# Patient Record
Sex: Female | Born: 1990 | Race: White | Hispanic: No | Marital: Single | Smoking: Current every day smoker
Health system: Southern US, Community
[De-identification: ages and names within clinical notes are randomized; demographics above are authoritative.]

## PROBLEM LIST (undated history)

## (undated) DIAGNOSIS — E079 Disorder of thyroid, unspecified: Secondary | ICD-10-CM

## (undated) HISTORY — PX: BUNIONECTOMY: SHX129

---

## 2017-03-12 ENCOUNTER — Encounter (HOSPITAL_COMMUNITY): Payer: Self-pay | Admitting: Emergency Medicine

## 2017-03-12 ENCOUNTER — Emergency Department (HOSPITAL_COMMUNITY)
Admission: EM | Admit: 2017-03-12 | Discharge: 2017-03-12 | Disposition: A | Discharge status: Home / Self Care | Payer: Medicaid Other | Attending: Emergency Medicine | Admitting: Emergency Medicine

## 2017-03-12 ENCOUNTER — Emergency Department (HOSPITAL_COMMUNITY): Payer: Medicaid Other

## 2017-03-12 DIAGNOSIS — O2341 Unspecified infection of urinary tract in pregnancy, first trimester: Secondary | ICD-10-CM | POA: Insufficient documentation

## 2017-03-12 DIAGNOSIS — Z3A13 13 weeks gestation of pregnancy: Secondary | ICD-10-CM | POA: Diagnosis not present

## 2017-03-12 DIAGNOSIS — O9989 Other specified diseases and conditions complicating pregnancy, childbirth and the puerperium: Secondary | ICD-10-CM | POA: Insufficient documentation

## 2017-03-12 DIAGNOSIS — O26899 Other specified pregnancy related conditions, unspecified trimester: Secondary | ICD-10-CM

## 2017-03-12 DIAGNOSIS — M545 Low back pain, unspecified: Secondary | ICD-10-CM

## 2017-03-12 DIAGNOSIS — N390 Urinary tract infection, site not specified: Secondary | ICD-10-CM

## 2017-03-12 DIAGNOSIS — R102 Pelvic and perineal pain: Secondary | ICD-10-CM

## 2017-03-12 HISTORY — DX: Disorder of thyroid, unspecified: E07.9

## 2017-03-12 LAB — URINALYSIS, ROUTINE W REFLEX MICROSCOPIC
Bilirubin Urine: NEGATIVE
Glucose, UA: NEGATIVE mg/dL
Hgb urine dipstick: NEGATIVE
Ketones, ur: NEGATIVE mg/dL
Nitrite: NEGATIVE
Protein, ur: NEGATIVE mg/dL
Specific Gravity, Urine: 1.016 (ref 1.005–1.030)
pH: 6 (ref 5.0–8.0)

## 2017-03-12 LAB — I-STAT BETA HCG BLOOD, ED (MC, WL, AP ONLY): I-stat hCG, quantitative: >2000 m[IU]/mL — ABNORMAL HIGH (ref <5)

## 2017-03-12 MED ORDER — CEPHALEXIN 500 MG PO CAPS: 500.0000 mg | ORAL_CAPSULE | Freq: Two times a day (BID) | ORAL | 0 refills | Status: DC

## 2017-03-12 NOTE — Discharge Instructions (Addendum)
Please read attached information. If you experience any new or worsening signs or symptoms please return to the emergency room for evaluation. Please follow-up with your primary care provider or specialist as discussed.  °

## 2017-03-12 NOTE — ED Notes (Signed)
ED Provider at bedside. 

## 2017-03-12 NOTE — ED Notes (Addendum)
Pt reports she has had back pain and abdominal pain. She also reports having been pregnant 8 times with one living child who is 7 months. Pt states she is somewhere less than 20 weeks but has not been to an OB.

## 2017-03-12 NOTE — ED Triage Notes (Signed)
Pt reports [redacted] wks pregnant, hip, lower back and lower abd pain x2 weeks, denies n/v/d or urinary symptoms or bleeding, had another pregnancy 7 months ago.

## 2017-03-12 NOTE — ED Notes (Signed)
Pt transported to [facility]

## 2017-03-12 NOTE — ED Provider Notes (Signed)
[facility]-EMERGENCY DEPT Provider Note   CSN: [id number] Arrival date & time: 03/12/17  1455     History   Chief Complaint Chief Complaint  Patient presents with  . Back Pain  . Abdominal Pain    HPI Lori Woods is a 26 y.o. female.  HPI    [age] YOF Lori Woods female presents today with complaints of back pain and abdominal pain.  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  She is unsure of her last menstrual cycle but believes it was at the end of May 2018.  Patient notes that she was seen in  several weeks ago with an ultrasound showing 1 intrauterine pregnancy without complication.  Patient notes that today she developed low back pain and abdominal cramping.  She notes that this is similar to previous pregnancies, but the back pain is slightly more severe.  She notes this is worse with certain movements, denies any radiation into her lower extremities.  She denies any vaginal bleeding or discharge.  She denies any nausea, vomiting, fever.    Past Medical History:  Diagnosis Date  . Thyroid disease    hypo    There are no active problems to display for this patient.   Past Surgical History:  Procedure Laterality Date  . BUNIONECTOMY Bilateral     OB History    Gravida Para Term Preterm AB Living   1             SAB TAB Ectopic Multiple Live Births                   Home Medications    Prior to Admission medications   Medication Sig Start Date End Date Taking? Authorizing Provider  cephALEXin (KEFLEX) 500 MG capsule Take 1 capsule (500 mg total) by mouth 2 (two) times daily. 03/12/17   Eyvonne MechanicHedges, Harrison Paulson, PA-C    Family History No family history on file.  Social History Social History  Substance Use Topics  . Smoking status: Not on file  . Smokeless tobacco: Not on file  . Alcohol use Not on file     Allergies   Patient has no known allergies.   Review of Systems Review of Systems    All other systems reviewed and are negative.    Physical Exam Updated Vital Signs BP 107/69 (BP Location: Left Arm)   Pulse 80   Temp 97.8 F (36.6 C) (Oral)   Resp 14   SpO2 99%   Physical Exam  Constitutional: She is oriented to person, place, and time. She appears well-developed and well-nourished.  HENT:  Head: Normocephalic and atraumatic.  Eyes: Pupils are equal, round, and reactive to light. Conjunctivae are normal. Right eye exhibits no discharge. Left eye exhibits no discharge. No scleral icterus.  Neck: Normal range of motion. No JVD present. No tracheal deviation present.  Pulmonary/Chest: Effort normal. No stridor.  Abdominal: Soft. She exhibits no distension and no mass. There is no tenderness. There is no rebound and no guarding. No hernia.  No fetal heart tones noted  Musculoskeletal:  Tenderness palpation bilateral lower lumbar musculature, no significant midline tenderness  Neurological: She is alert and oriented to person, place, and time. Coordination normal.  Psychiatric: She has a normal mood and affect. Her behavior is normal. Judgment and thought content normal.  Nursing note and vitals reviewed.    ED Treatments / Results  Labs (all labs ordered are listed, but only  abnormal results are displayed) Labs Reviewed  URINALYSIS, ROUTINE W REFLEX MICROSCOPIC - Abnormal; Notable for the following:       Result Value   APPearance CLOUDY (*)    Leukocytes, UA LARGE (*)    Bacteria, UA FEW (*)    Squamous Epithelial / LPF 6-30 (*)    All other components within normal limits  I-STAT BETA HCG BLOOD, ED ([facility], [facility], [facility] ONLY) - Abnormal; Notable for the following:    I-stat hCG, quantitative >2,000.0 (*)    All other components within normal limits    EKG  EKG Interpretation None       Radiology US Ob Comp Less 14 Wks  Result Date: 03/12/2017 CLINICAL DATA:  Pregnant patient in first-trimester pregnancy with pelvic pain. EXAM: OBSTETRIC <14 WK  ULTRASOUND TECHNIQUE: Transabdominal ultrasound was performed for evaluation of the gestation as well as the maternal uterus and adnexal regions. COMPARISON:  None. FINDINGS: Intrauterine gestational sac: Single Yolk sac:  Not Visualized, likely due to gestational age. Embryo:  Visualized. Cardiac Activity: Visualized. Heart Rate: 149 bpm CRL:   73.7  mm   13 w 3 d                  [place] EDC: 09/14/2017 Subchorionic hemorrhage:  None visualized. Maternal uterus/adnexae: The left ovary measures 3.6 x 1.9 x 2.5 cm with possible corpus luteal cyst. Blood flow is noted. The right ovary is not seen. No adnexal mass. No pelvic free fluid. IMPRESSION: Single live intrauterine pregnancy estimated gestational age [redacted] weeks 3 days for estimated date of delivery 09/14/2017. Electronically Signed   By: Rubye Oaks M.D.   On: 03/12/2017 21:33    Procedures Procedures (including critical care time)  Medications Ordered in ED Medications - No data to display   Initial Impression / Assessment and Plan / ED Course  I have reviewed the triage vital signs and the nursing notes.  Pertinent labs & imaging results that were available during my care of the patient were reviewed by me and considered in my medical decision making (see chart for details).      Final Clinical Impressions(s) / ED Diagnoses   Final diagnoses:  Acute bilateral low back pain without sciatica  [redacted] weeks gestation of pregnancy  Urinary tract infection without hematuria, site unspecified    Labs:   Imaging: Ultrasound OB  Consults:  Therapeutics:  Discharge Meds:   Assessment/Plan: 26 year old female presents today with complaints of back pain, abdominal pain.  Patient has a soft benign abdomen.  Patient is denying any vaginal discharge or bleeding.  I was unable to hear fetal heart tones at bedside.  Formal ultrasound will be performed.  Radiology is having difficulty with transferring images over for radiology read.  Patient  care will be signed out to oncoming provider pending ultrasound results and disposition.  Exam shows single live intrauterine pregnancy at a gestational age of [redacted] weeks 3 days.  Patient also has what appears to be asymptomatic UTI.  She will be treated with Keflex.  Patient is discharged home with outpatient follow-up, strict return precautions.  She verbalized understanding and agreement to today's plan had no further questions or concerns at time discharge.   New Prescriptions New Prescriptions   CEPHALEXIN (KEFLEX) 500 MG CAPSULE    Take 1 capsule (500 mg total) by mouth 2 (two) times daily.     Eyvonne Mechanic, PA-C 03/12/17 2110    Eyvonne Mechanic, PA-C 03/12/17 2152    Rubin Payor,  Harrold Donath, MD 03/13/17 1610

## 2018-10-18 ENCOUNTER — Encounter (HOSPITAL_COMMUNITY): Payer: Self-pay | Admitting: *Deleted

## 2018-10-18 ENCOUNTER — Emergency Department (HOSPITAL_COMMUNITY): Payer: Medicaid Other

## 2018-10-18 ENCOUNTER — Emergency Department (HOSPITAL_COMMUNITY)
Admission: EM | Admit: 2018-10-18 | Discharge: 2018-10-18 | Disposition: A | Discharge status: Home / Self Care | Payer: Medicaid Other | Attending: Emergency Medicine | Admitting: Emergency Medicine

## 2018-10-18 ENCOUNTER — Other Ambulatory Visit: Payer: Self-pay

## 2018-10-18 DIAGNOSIS — Z79899 Other long term (current) drug therapy: Secondary | ICD-10-CM | POA: Insufficient documentation

## 2018-10-18 DIAGNOSIS — F17210 Nicotine dependence, cigarettes, uncomplicated: Secondary | ICD-10-CM | POA: Diagnosis not present

## 2018-10-18 DIAGNOSIS — Z20828 Contact with and (suspected) exposure to other viral communicable diseases: Secondary | ICD-10-CM | POA: Diagnosis not present

## 2018-10-18 DIAGNOSIS — R05 Cough: Secondary | ICD-10-CM | POA: Diagnosis present

## 2018-10-18 DIAGNOSIS — Z20822 Contact with and (suspected) exposure to covid-19: Secondary | ICD-10-CM

## 2018-10-18 LAB — GROUP A STREP BY PCR: Group A Strep by PCR: NOT DETECTED

## 2018-10-18 NOTE — ED Provider Notes (Signed)
[facility] EMERGENCY DEPARTMENT Provider Note   CSN: [id number] Arrival date & time: 10/18/18  1459    History   Chief Complaint Chief Complaint  Patient presents with  . Cough    HPI Lori Woods is a 28 y.o. female.     HPI  Pt was seen at 1505. Per pt, c/o gradual onset and persistence of 2 separate episodes of "exposure" that occurred within the past 3 days. 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 States she was wearing a mask and gloves. Pt states she was told to either go to PCP or ED "to get checked out." States PCP does not perform COVID testing so she came to the ED. Pt denies any new symptoms over the past 3 days. States she has "a chronic cough for years because I'm a smoker" and had "a scratchy throat" 4 days ago. Denies fevers. Denies SOB, no CP/palpitations, no abd pain, no N/V/D, no loss of smell, no rash, no focal motor weakness.      Past Medical History:  Diagnosis Date  . Thyroid disease    hypo    There are no active problems to display for this patient.   Past Surgical History:  Procedure Laterality Date  . BUNIONECTOMY Bilateral      OB History    Gravida  1   Para      Term      Preterm      AB      Living        SAB      TAB      Ectopic      Multiple      Live Births               Home Medications    Prior to Admission medications   Medication Sig Start Date End Date Taking? Authorizing Provider  cephALEXin (KEFLEX) 500 MG capsule Take 1 capsule (500 mg total) by mouth 2 (two) times daily. 03/12/17   Eyvonne Mechanic, PA-C    Family History No family history on file.  Social History Social History   Tobacco Use  . Smoking status: Current Every Day Smoker    Types: Cigarettes  . Smokeless tobacco:  Never Used  Substance Use Topics  . Alcohol use: Not on file  . Drug use: Not on file     Allergies   Patient has no known allergies.   Review of Systems Review of Systems ROS: Statement: All systems negative except as marked or noted in the HPI; Constitutional: Negative for fever and chills. ; ; Eyes: Negative for eye pain, redness and discharge. ; ; ENMT: Negative for ear pain, hoarseness, nasal congestion, sinus pressure. +scratchy throat 4 days ago. ; ; Cardiovascular: Negative for chest pain, palpitations, diaphoresis, dyspnea and peripheral edema. ; ; Respiratory: +chronic cough. Negative for wheezing and stridor. ; ; Gastrointestinal: Negative for nausea, vomiting, diarrhea, abdominal pain, blood in stool, hematemesis, jaundice and rectal bleeding. . ; ; Genitourinary: Negative for dysuria, flank pain and hematuria. ; ; Musculoskeletal: Negative for back pain and neck pain. Negative for swelling and trauma.; ; Skin: Negative for pruritus, rash, abrasions, blisters, bruising and skin lesion.; ; Neuro: Negative for headache, lightheadedness and neck stiffness. Negative for weakness, altered level  of consciousness, altered mental status, extremity weakness, paresthesias, involuntary movement, seizure and syncope.       Physical Exam Updated Vital Signs BP (!) 112/59   Pulse 68   Ht 5' (1.524 m)   Wt 68 kg   LMP  (LMP Unknown)   SpO2 98%   Breastfeeding Unknown   BMI 29.29 kg/m  (pt apparently refusing VS)  Physical Exam 1510: Physical examination:  Nursing notes reviewed; Vital signs and O2 SAT reviewed;  Constitutional: Well developed, Well nourished, Well hydrated, In no acute distress; Head:  Normocephalic, atraumatic; Eyes: EOMI, PERRL, No scleral icterus; ENMT: TM's clear bilat. Mouth and pharynx without lesions. No tonsillar exudates. No intra-oral edema. No submandibular or sublingual edema. No hoarse voice, no drooling, no stridor. No pain with manipulation of larynx. No  trismus. Mouth and pharynx normal, Mucous membranes moist; Neck: Supple, Full range of motion, No lymphadenopathy; Cardiovascular: Regular rate and rhythm, No gallop; Respiratory: Breath sounds clear & equal bilaterally, No wheezes.  Speaking full sentences with ease, Normal respiratory effort/excursion; Chest: Nontender, Movement normal; Abdomen: Soft, Nontender, Nondistended, Normal bowel sounds; Genitourinary: No CVA tenderness; Extremities: Peripheral pulses normal, No tenderness, No edema, No calf edema or asymmetry.; Neuro: AA&Ox3, Major CN grossly intact.  Speech clear. No gross focal motor or sensory deficits in extremities. Climbs on and off stretcher easily by herself. Gait steady..; Skin: Color normal, Warm, Dry.     ED Treatments / Results  Labs (all labs ordered are listed, but only abnormal results are displayed)   EKG None  Radiology   Procedures Procedures (including critical care time)  Medications Ordered in ED Medications - No data to display   Initial Impression / Assessment and Plan / ED Course  I have reviewed the triage vital signs and the nursing notes.  Pertinent labs & imaging results that were available during my care of the patient were reviewed by me and considered in my medical decision making (see chart for details).    MDM Reviewed: previous chart, nursing note and vitals Interpretation: labs and x-ray   Results for orders placed or performed during the hospital encounter of 10/18/18  Group A Strep by PCR  Result Value Ref Range   Group A Strep by PCR NOT DETECTED NOT DETECTED   Dg Chest Port 1 View Result Date: 10/18/2018 CLINICAL DATA:  Chronic cough.  Recent COVID-19 exposure.  No fever. EXAM: PORTABLE CHEST 1 VIEW COMPARISON:  None. FINDINGS: The heart size and mediastinal contours are within normal limits. Both lungs are clear. The visualized skeletal structures are unremarkable. IMPRESSION: No active disease. Electronically Signed   By:  Gerome Sam III M.D   On: 10/18/2018 16:19       Lori Woods was evaluated in Emergency Department on 10/18/2018 for the symptoms described in the history of present illness. She was evaluated in the context of the global COVID-19 pandemic, which necessitated consideration that the patient might be at risk for infection with the SARS-CoV-2 virus that causes COVID-19. Institutional protocols and algorithms that pertain to the evaluation of patients at risk for COVID-19 are in a state of rapid change based on information released by regulatory bodies including the CDC and federal and state organizations. These policies and algorithms were followed during the patient's care in the ED.    1625:  Workup reassuring. Pt denies any new symptoms since exposure 2 and 3 days ago. Pt instructed to follow current guidelines regarding home quarantine; paperwork signed and instructions given  to pt who verb understanding, but feels she does not need to do this and only came to the ED because her "employer" wanted her to.  Return precautions given. Refuses VS. Will d/c stable. Dx and testing, d/w pt.  Questions answered.  Verb understanding, agreeable to d/c home with outpt f/u.       Final Clinical Impressions(s) / ED Diagnoses   Final diagnoses:  None    ED Discharge Orders    None       Samuel JesterMcManus, Saraiah Bhat, DO 10/21/18 1625

## 2018-10-18 NOTE — Discharge Instructions (Addendum)
Person Under Monitoring Name: Lori Woods  [place] [address] [place] [id number]   Infection Prevention Recommendations for Individuals Confirmed to have, or Being Evaluated for, 2019 Novel Coronavirus (COVID-19) Infection Who Receive Care at Home  Individuals who are confirmed to have, or are being evaluated for, COVID-19 should follow the prevention steps below until a healthcare provider or local or state health department says they can return to normal activities.  Stay home except to get medical care You should restrict activities outside your home, except for getting medical care. Do not go to work, school, or public areas, and do not use public transportation or taxis.  Call ahead before visiting your doctor Before your medical appointment, call the healthcare provider and tell them that you have, or are being evaluated for, COVID-19 infection. This will help the healthcare providers office take steps to keep other people from getting infected. Ask your healthcare provider to call the local or state health department.  Monitor your symptoms Seek prompt medical attention if your illness is worsening (e.g., difficulty breathing). Before going to your medical appointment, call the healthcare provider and tell them that you have, or are being evaluated for, COVID-19 infection. Ask your healthcare provider to call the local or state health department.  Wear a facemask You should wear a facemask that covers your nose and mouth when you are in the same room with other people and when you visit a healthcare provider. People who live with or visit you should also wear a facemask while they are in the same room with you.  Separate yourself from other people in your home As much as possible, you should stay in a different room from other people in your home. Also, you should use a separate bathroom, if available.  Avoid sharing household items You should not share  dishes, drinking glasses, cups, eating utensils, towels, bedding, or other items with other people in your home. After using these items, you should wash them thoroughly with soap and water.  Cover your coughs and sneezes Cover your mouth and nose with a tissue when you cough or sneeze, or you can cough or sneeze into your sleeve. Throw used tissues in a lined trash can, and immediately wash your hands with soap and water for at least 20 seconds or use an alcohol-based hand rub.  Wash your [employer]hands Wash your hands often and thoroughly with soap and water for at least 20 seconds. You can use an alcohol-based hand sanitizer if soap and water are not available and if your hands are not visibly dirty. Avoid touching your eyes, nose, and mouth with unwashed hands.   Prevention Steps for Caregivers and Household Members of Individuals Confirmed to have, or Being Evaluated for, COVID-19 Infection Being Cared for in the Home  If you live with, or provide care at home for, a person confirmed to have, or being evaluated for, COVID-19 infection please follow these guidelines to prevent infection:  Follow healthcare providers instructions Make sure that you understand and can help the patient follow any healthcare provider instructions for all care.  Provide for the patients basic needs You should help the patient with basic needs in the home and provide support for getting groceries, prescriptions, and other personal needs.  Monitor the patients symptoms If they are getting sicker, call his or her medical provider and tell them that the patient has, or is being evaluated for, COVID-19 infection. This will help the healthcare providers office take  steps to keep other people from getting infected. Ask the healthcare provider to call the local or state health department.  Limit the number of people who have contact with the patient If possible, have only one caregiver for the patient. Other  household members should stay in another home or place of residence. If this is not possible, they should stay in another room, or be separated from the patient as much as possible. Use a separate bathroom, if available. Restrict visitors who do not have an essential need to be in the home.  Keep older adults, very young children, and other sick people away from the patient Keep older adults, very young children, and those who have compromised immune systems or chronic health conditions away from the patient. This includes people with chronic heart, lung, or kidney conditions, diabetes, and cancer.  Ensure good ventilation Make sure that shared spaces in the home have good air flow, such as from an air conditioner or an opened window, weather permitting.  Wash your hands often Wash your hands often and thoroughly with soap and water for at least 20 seconds. You can use an alcohol based hand sanitizer if soap and water are not available and if your hands are not visibly dirty. Avoid touching your eyes, nose, and mouth with unwashed hands. Use disposable paper towels to dry your hands. If not available, use dedicated cloth towels and replace them when they become wet.  Wear a facemask and gloves Wear a disposable facemask at all times in the room and gloves when you touch or have contact with the patients blood, body fluids, and/or secretions or excretions, such as sweat, saliva, sputum, nasal mucus, vomit, urine, or feces.  Ensure the mask fits over your nose and mouth tightly, and do not touch it during use. Throw out disposable facemasks and gloves after using them. Do not reuse. Wash your hands immediately after removing your facemask and gloves. If your personal clothing becomes contaminated, carefully remove clothing and launder. Wash your hands after handling contaminated clothing. Place all used disposable facemasks, gloves, and other waste in a lined container before disposing them with  other household waste. Remove gloves and wash your hands immediately after handling these items.  Do not share dishes, glasses, or other household items with the patient Avoid sharing household items. You should not share dishes, drinking glasses, cups, eating utensils, towels, bedding, or other items with a patient who is confirmed to have, or being evaluated for, COVID-19 infection. After the person uses these items, you should wash them thoroughly with soap and water.  Wash laundry thoroughly Immediately remove and wash clothes or bedding that have blood, body fluids, and/or secretions or excretions, such as sweat, saliva, sputum, nasal mucus, vomit, urine, or feces, on them. Wear gloves when handling laundry from the patient. Read and follow directions on labels of laundry or clothing items and detergent. In general, wash and dry with the warmest temperatures recommended on the label.  Clean all areas the individual has used often Clean all touchable surfaces, such as counters, tabletops, doorknobs, bathroom fixtures, toilets, phones, keyboards, tablets, and bedside tables, every day. Also, clean any surfaces that may have blood, body fluids, and/or secretions or excretions on them. Wear gloves when cleaning surfaces the patient has come in contact with. Use a diluted bleach solution (e.g., dilute bleach with 1 part bleach and 10 parts water) or a household disinfectant with a label that says EPA-registered for coronaviruses. To make a bleach solution  at home, add 1 tablespoon of bleach to 1 quart (4 cups) of water. For a larger supply, add  cup of bleach to 1 gallon (16 cups) of water. Read labels of cleaning products and follow recommendations provided on product labels. Labels contain instructions for safe and effective use of the cleaning product including precautions you should take when applying the product, such as wearing gloves or eye protection and making sure you have good ventilation  during use of the product. Remove gloves and wash hands immediately after cleaning.  Monitor yourself for signs and symptoms of illness Caregivers and household members are considered close contacts, should monitor their health, and will be asked to limit movement outside of the home to the extent possible. Follow the monitoring steps for close contacts listed on the symptom monitoring form.   ? If you have additional questions, contact your local health department or call the epidemiologist on call at [phone] (available 24/7). ? This guidance is subject to change. For the most up-to-date guidance from [facility], please refer to their website: [employer]Met[employer].hu   Take your usual prescriptions as previously directed.  Monitor yourself for symptoms for the next 14 days. Remain at home under self quarantine for at least 7 days since any symptoms appeared and you have at at least 3 days without fever without the use of fever reducing medications and you have improvement in any respiratory symptoms. Call your regular medical doctor on Monday to schedule a follow up appointment within the next week.  Return to the Emergency Department immediately sooner if worsening.

## 2018-10-18 NOTE — ED Notes (Signed)
Refused repeat vital signs.  States she just wants to go home to her kids and does not think she needs to be quarantined.  Feels as if everyone is going to get Covid and nothing will prevent it anyways.  Reports she only came because her "employer" required her to, but would not tell who this is.  Pt had to be told she must wear a mask to exit the building.

## 2018-10-18 NOTE — ED Triage Notes (Signed)
Pt found out a client was positive covid 19 yesterday, pt was exposed on Thursday and Friday. Denies fever. Denies loss of smell.

## 2019-04-13 ENCOUNTER — Ambulatory Visit: Payer: Medicaid Other | Admitting: Advanced Practice Midwife

## 2019-04-13 ENCOUNTER — Encounter: Payer: Self-pay | Admitting: Advanced Practice Midwife

## 2019-04-13 ENCOUNTER — Other Ambulatory Visit: Payer: Self-pay

## 2019-04-13 DIAGNOSIS — B9689 Other specified bacterial agents as the cause of diseases classified elsewhere: Secondary | ICD-10-CM

## 2019-04-13 DIAGNOSIS — Z113 Encounter for screening for infections with a predominantly sexual mode of transmission: Secondary | ICD-10-CM

## 2019-04-13 DIAGNOSIS — F4310 Post-traumatic stress disorder, unspecified: Secondary | ICD-10-CM | POA: Insufficient documentation

## 2019-04-13 DIAGNOSIS — F17200 Nicotine dependence, unspecified, uncomplicated: Secondary | ICD-10-CM

## 2019-04-13 DIAGNOSIS — N760 Acute vaginitis: Secondary | ICD-10-CM | POA: Diagnosis not present

## 2019-04-13 DIAGNOSIS — F419 Anxiety disorder, unspecified: Secondary | ICD-10-CM

## 2019-04-13 DIAGNOSIS — F325 Major depressive disorder, single episode, in full remission: Secondary | ICD-10-CM

## 2019-04-13 DIAGNOSIS — Z915 Personal history of self-harm: Secondary | ICD-10-CM

## 2019-04-13 DIAGNOSIS — B009 Herpesviral infection, unspecified: Secondary | ICD-10-CM | POA: Diagnosis not present

## 2019-04-13 DIAGNOSIS — Z8659 Personal history of other mental and behavioral disorders: Secondary | ICD-10-CM

## 2019-04-13 DIAGNOSIS — Z9151 Personal history of suicidal behavior: Secondary | ICD-10-CM

## 2019-04-13 LAB — WET PREP FOR TRICH, YEAST, CLUE
Trichomonas Exam: NEGATIVE
Yeast Exam: NEGATIVE

## 2019-04-13 MED ORDER — ACYCLOVIR 400 MG PO TABS: 400.0000 mg | ORAL_TABLET | Freq: Three times a day (TID) | ORAL | 0 refills | Status: AC

## 2019-04-13 MED ORDER — METRONIDAZOLE 500 MG PO TABS
500.0000 mg | ORAL_TABLET | Freq: Two times a day (BID) | ORAL | 0 refills | Status: AC
Start: 2019-04-13 — End: 2019-04-20

## 2019-04-13 NOTE — Progress Notes (Signed)
Wet mount reviewed and pt treated for BV per standing order, per Ola Spurr, CNM verbal order. Pt also received Acyclovir 400mg  #30 per provider orders. [facility] card with contact info given to pt per provider order and per pt request. Info given to pt per provider orders. Provider orders completed.Ronny Bacon, RN

## 2019-04-13 NOTE — Progress Notes (Signed)
STI clinic/screening visit  Subjective:  Lori Woods is a 28 y.o.G9P2 female being seen today for an STI screening visit. The patient reports they do have symptoms.  Patient has the following medical conditions:   Patient Active Problem List   Diagnosis Date Noted  . Herpes 04/13/2019  . PTSD (post-traumatic stress disorder) 04/13/2019  . Anxiety 04/13/2019  . Depression, major, single episode, complete remission ([place]) 04/13/2019  . H/O borderline personality disorder 04/13/2019  . Smoker 04/13/2019  . Hx of suicide attempt 04/13/2019     No chief complaint on file.   HPI  Patient reports tonsillitis diagnosed and last dose Amoxicillin BID today.  Last used Monistat 2 days ago.  C/o unprotected sex 04/08/19 and next day vaginal irritation, pain, tenderness despite trying "everything".  Here with 2 toddler daughters and just moved here from [place].  LMP 02/26/19.  Last pap 08/2018 neg per pt  See flowsheet for further details and programmatic requirements.    The following portions of the patient's history were reviewed and updated as appropriate: allergies, current medications, past medical history, past social history, past surgical history and problem list.  Objective:  There were no vitals filed for this visit.  Physical Exam Constitutional:      Appearance: Normal appearance. She is normal weight.  HENT:     Mouth/Throat:     Mouth: Mucous membranes are moist.     Pharynx: Posterior oropharyngeal erythema (tonsillitis--on antibiotics) present.  Eyes:     Conjunctiva/sclera: Conjunctivae normal.  Neck:     Musculoskeletal: Neck supple.  Pulmonary:     Breath sounds: Normal breath sounds.  Abdominal:     Palpations: Abdomen is soft.     Comments: Soft without tenderness, fair tone  Enlarged cervical lymph nodes from tonsillitis dx recently  Genitourinary:    General: Normal vulva.     Exam position: Lithotomy position.     Labia:        Right: Lesion  (numerous HSV lesions at clitorus & along labia minora) present.      Vagina: Tenderness (from HSV) and lesions (externally on labia minora) present.     Cervix: Normal.     Uterus: Normal.      Adnexa: Right adnexa normal and left adnexa normal.     Rectum: Normal.     Comments: Mirena IUD string palpated and visualized Mirena inserted 08/2017  Lymphadenopathy:     Lower Body: No right inguinal adenopathy. No left inguinal adenopathy.  Skin:    General: Skin is warm and dry.  Neurological:     Mental Status: She is alert.    Small amout white leukorrhea, ph>4.5   Assessment and Plan:  Lori Woods is a 28 y.o. female presenting to the [facility] Department for STI screening  1. Herpes Give Acyclovir 400 mg #30 TID x 10 days for initial outbreak Please give HSV brochure to pt HSV culture done today  2. Screening examination for venereal disease Treat wet mount per standing orders Treat BV per standing orders Immunization nurse consult Please give list of primary care MD to pt - WET PREP FOR [place], [facility], [place] [facility] - Syphilis Serology,  Lab - HBV Antigen/Antibody [facility] Lab - HIV/HCV  Lab - Virology, [facility]  3. PTSD (post-traumatic stress disorder) Desires referral to [facility]  4. Anxiety Desires referral to [facility]   5. Depression, major, single episode, complete remission ([place]) Please give  pt [facility] # for counseling--pt desires  6. H/O borderline personality disorder Desires referral to [facility]  7. Smoker 1 1/2 ppd--counseled via [facility] to stop smoking  8. Hx of suicide attempt 2015     Return if symptoms worsen or fail to improve.  No future appointments.  Alberteen Spindle, CNM

## 2019-04-14 ENCOUNTER — Telehealth: Payer: Self-pay | Admitting: Family Medicine

## 2019-04-14 NOTE — Telephone Encounter (Signed)
patient has questions about medication given to her at appt visit

## 2019-04-14 NOTE — Telephone Encounter (Signed)
TC with patient.  Patient reports herpes lesions very painful when urinating.  Co sit in water to urinate and can also try aquaphor ointment on lesions before urinating. Patient reports taking Acyclovir as directed. Instructed to call back if any other concerns. Aileen Fass, RN

## 2019-04-18 LAB — GONOCOCCUS CULTURE

## 2019-04-19 LAB — HEPATITIS B SURFACE ANTIGEN: Hepatitis B Surface Ag: NONREACTIVE

## 2019-04-21 ENCOUNTER — Telehealth: Payer: Self-pay

## 2019-04-22 ENCOUNTER — Telehealth: Payer: Self-pay

## 2019-04-22 NOTE — Telephone Encounter (Signed)
TC with patient. Verified ID via password. Informed of +HSV 2. Discussed disease process and tx options. Patient would like daily suppressive therapy with Acyclovir. Acyclovir 800mg  1 po qd #31 refills x 1 year ok per C. Hampton PA VO. Called into [facility]. Aileen Fass, RN

## 2019-04-22 NOTE — Telephone Encounter (Signed)
Consulted by RN re:  Patient request for suppressive treatment for HSV.  OK for RN to call in Acyclovir 800mg  #31 1 po QD with refills for 1 year.  Reviewed RN documentation and agree with note as documented.

## 2019-04-22 NOTE — Telephone Encounter (Signed)
Phone opened in error.

## 2019-04-22 NOTE — Telephone Encounter (Signed)
Patient wants to speak to nurse about the prescription sent to pharmacy.

## 2019-04-26 LAB — HM HEPATITIS C SCREENING LAB: HM Hepatitis Screen: NEGATIVE

## 2019-04-26 LAB — HM HIV SCREENING LAB: HM HIV Screening: NEGATIVE

## 2019-07-21 IMAGING — US US OB COMP LESS 14 WK
1 series · 14 of 28 positions shown · non-contrast
Comparison: None.

CLINICAL DATA: Pregnant patient in first-trimester pregnancy with
pelvic pain.

EXAM:
OBSTETRIC <14 WK ULTRASOUND
TECHNIQUE: Transabdominal ultrasound was performed for evaluation of the
gestation as well as the maternal uterus and adnexal regions.

[Series 1: us ob comp less 14 wk · 0.20mm/px · 32 acquisitions, 14 frames shown]
[im 2/32]
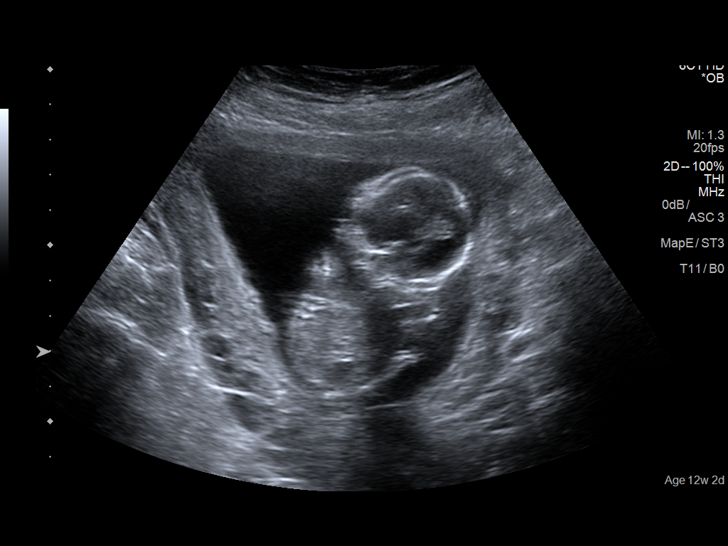
[im 4/32]
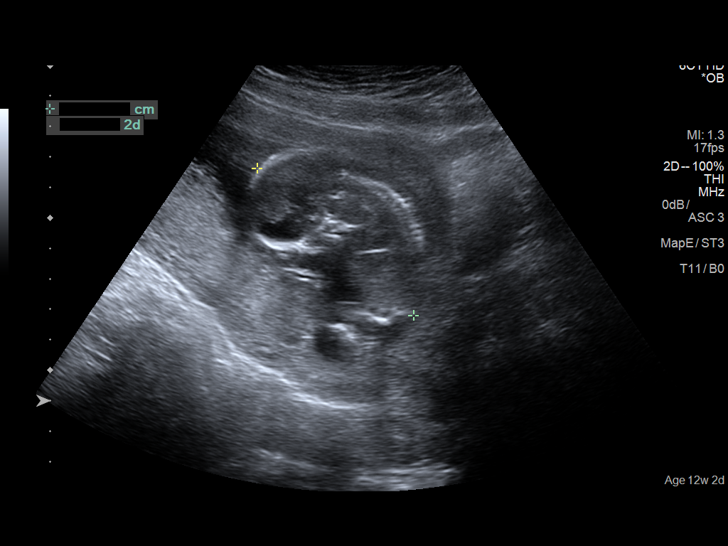
[im 6/32]
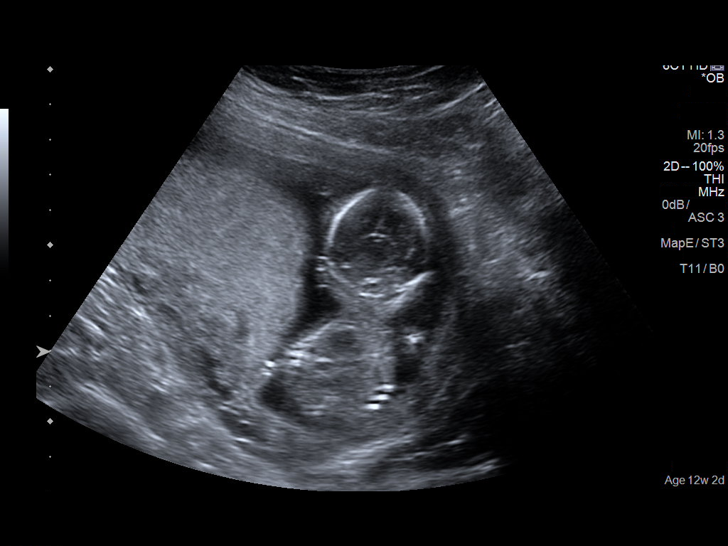
[im 9/32]
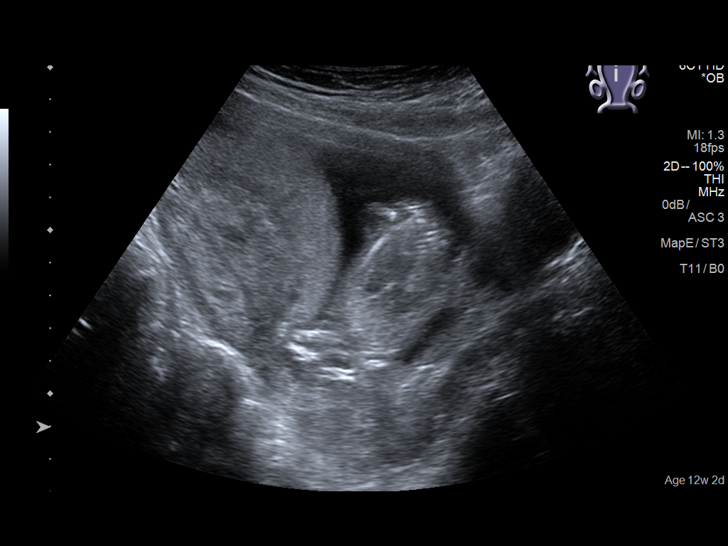
[im 11/32]
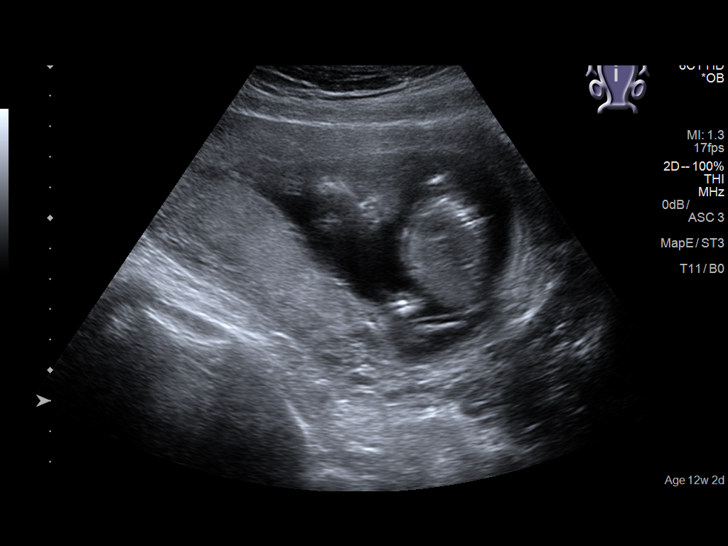
[im 13/32]
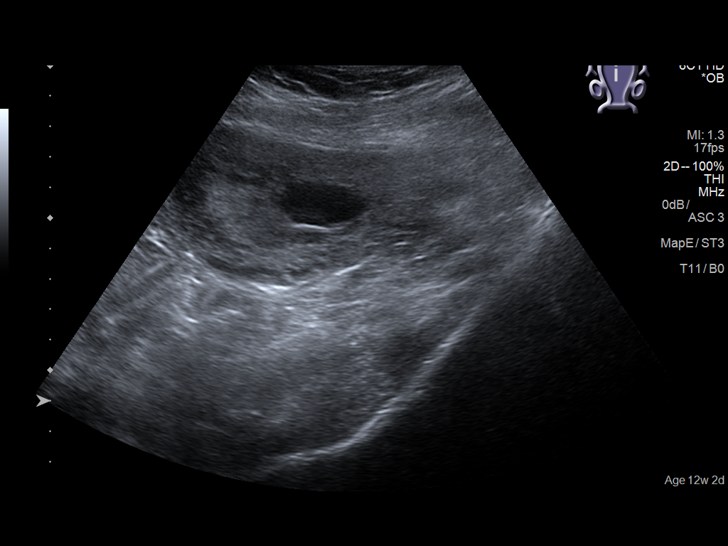
[im 15/32]
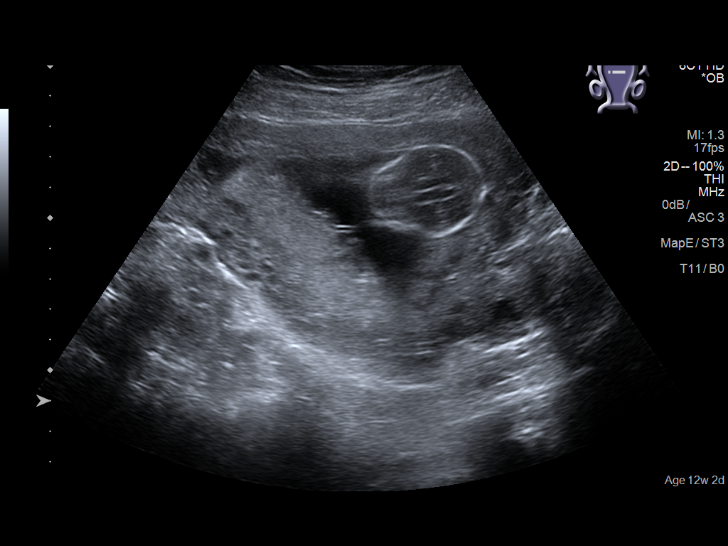
[im 18/32]
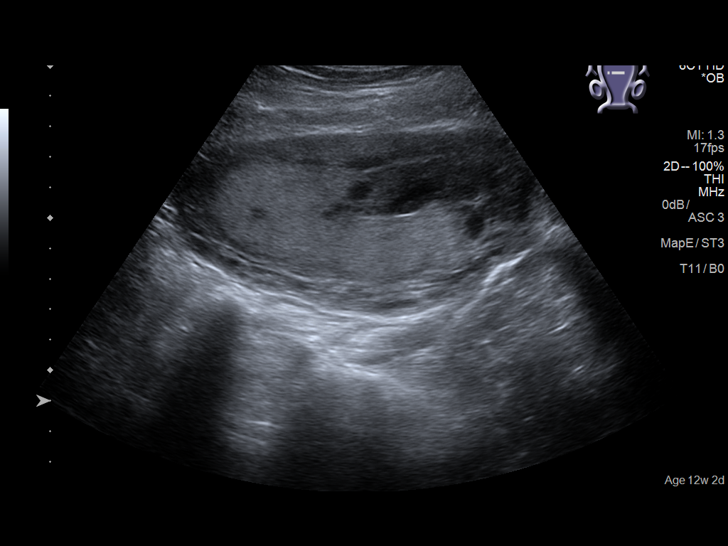
[im 20/32]
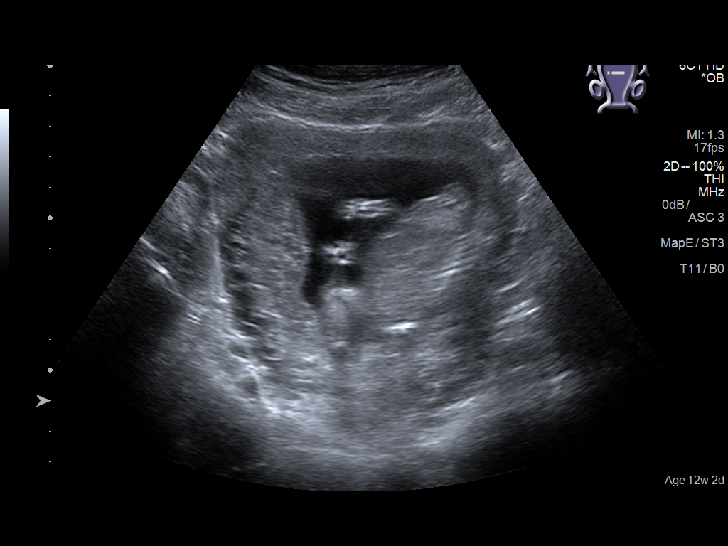
[im 22/32]
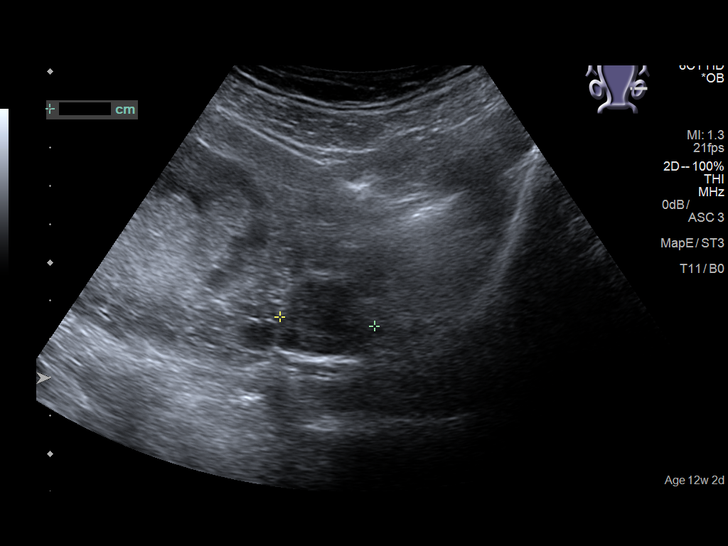
[im 25/32]
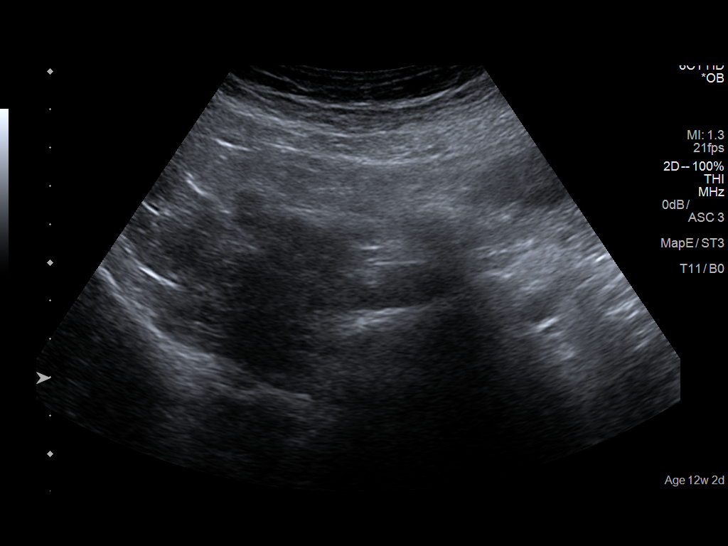
[im 27/32]
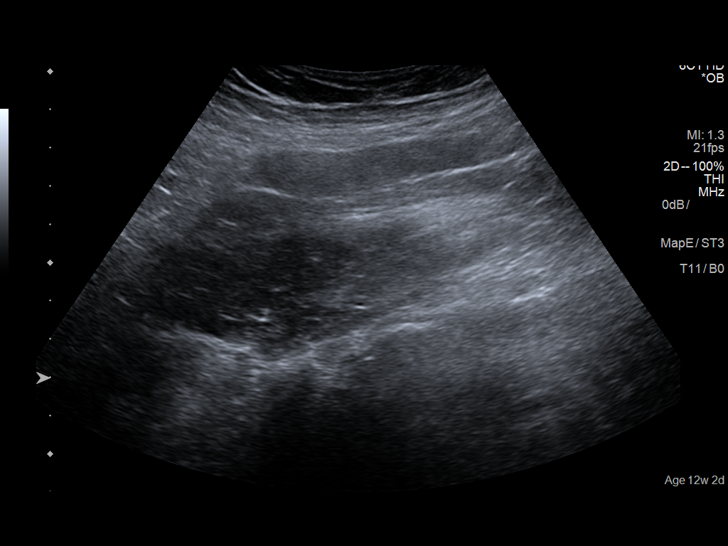
[im 29/32]
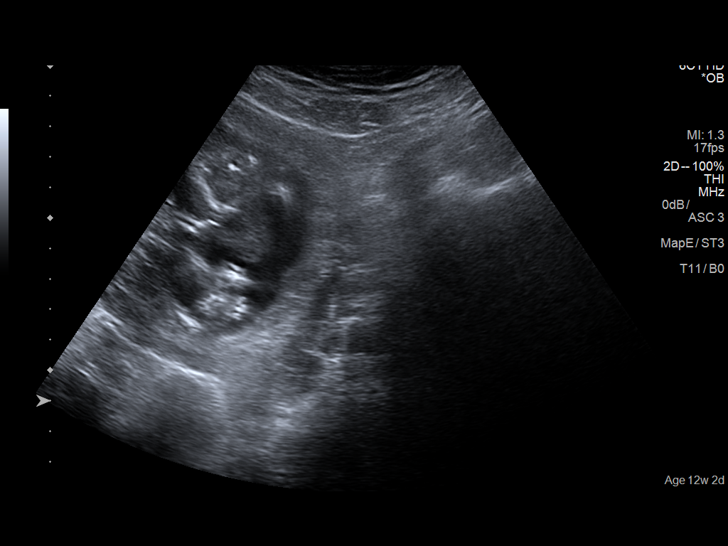
[im 32/32]
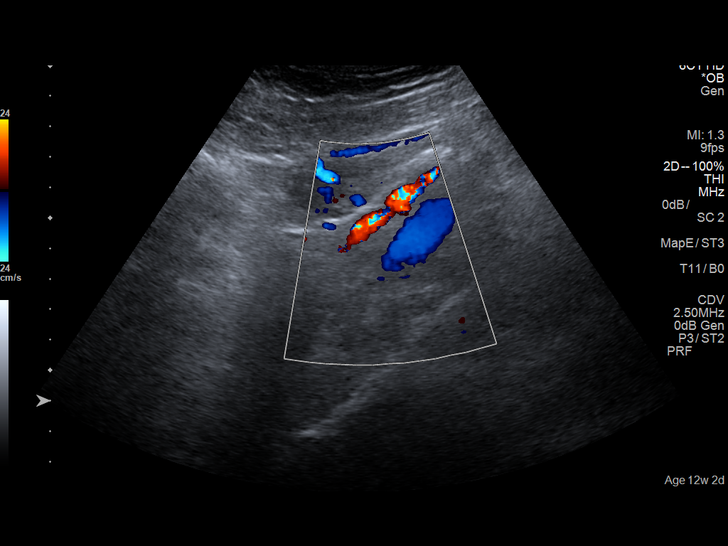

[14 of 28 positions shown; findings below may reference images not displayed]

FINDINGS: Intrauterine gestational sac: Single

Yolk sac:  Not Visualized, likely due to gestational age.

Embryo:  Visualized.

Cardiac Activity: Visualized.

Heart Rate: 149 bpm

CRL:   73.7  mm   13 w 3 d                  US EDC: 09/14/2017

Subchorionic hemorrhage:  None visualized.

Maternal uterus/adnexae: The left ovary measures 3.6 x 1.9 x 2.5 cm
with possible corpus luteal cyst. Blood flow is noted. The right
ovary is not seen. No adnexal mass. No pelvic free fluid.
IMPRESSION: Single live intrauterine pregnancy estimated gestational age 13
weeks 3 days for estimated date of delivery 09/14/2017.

## 2019-10-09 ENCOUNTER — Ambulatory Visit
Admission: EM | Admit: 2019-10-09 | Discharge: 2019-10-09 | Disposition: A | Discharge status: Home / Self Care | Payer: Medicaid Other

## 2019-10-09 ENCOUNTER — Ambulatory Visit: Payer: Medicaid Other | Attending: Oncology

## 2019-10-09 ENCOUNTER — Other Ambulatory Visit: Payer: Self-pay

## 2019-10-09 ENCOUNTER — Encounter: Payer: Self-pay | Admitting: Emergency Medicine

## 2019-10-09 DIAGNOSIS — T07XXXA Unspecified multiple injuries, initial encounter: Secondary | ICD-10-CM | POA: Diagnosis not present

## 2019-10-09 DIAGNOSIS — Z23 Encounter for immunization: Secondary | ICD-10-CM

## 2019-10-09 MED ORDER — MELOXICAM 15 MG PO TABS: 15.0000 mg | ORAL_TABLET | Freq: Every day | ORAL | 0 refills | Status: AC

## 2019-10-09 NOTE — Progress Notes (Signed)
   Covid-19 Vaccination Clinic  Name:  Maripat Borba    MRN: [id number] DOB: March 12, 1991  10/09/2019  Ms. Dejoy was observed post Covid-19 immunization for 15 minutes without incident. She was provided with Vaccine Information Sheet and instruction to access the V-Safe system.   Ms. Schoon was instructed to call 911 with any severe reactions post vaccine: Marland Kitchen Difficulty breathing  . Swelling of face and throat  . A fast heartbeat  . A bad rash all over body  . Dizziness and weakness   Immunizations Administered    Name Date Dose VIS Date Route   Pfizer COVID-19 Vaccine 10/09/2019  1:02 PM 0.3 mL 06/11/2019 Intramuscular   Manufacturer: [employer], [employer]   Lot: [id number]   NDC: [address]-2
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
## 2019-10-09 NOTE — Discharge Instructions (Signed)
Apply ice 20 minutes out of every 2 hours 4-5 times daily for comfort.  May use Tiger balm or lidocaine patches as necessary for local pain.  Recommend if you continue to have symptoms to follow-up with your primary care physician.

## 2019-11-03 ENCOUNTER — Ambulatory Visit: Payer: Medicaid Other | Attending: Internal Medicine

## 2019-11-03 DIAGNOSIS — Z23 Encounter for immunization: Secondary | ICD-10-CM

## 2019-11-03 NOTE — Progress Notes (Signed)
   Covid-19 Vaccination Clinic  Name:  Lori Woods    MRN: [id number] DOB: 10-23-1990  11/03/2019  Lori Woods was observed post Covid-19 immunization for 15 minutes without incident. She was provided with Vaccine Information Sheet and instruction to access the V-Safe system.   Lori Woods was instructed to call 911 with any severe reactions post vaccine: Marland Kitchen Difficulty breathing  . Swelling of face and throat  . A fast heartbeat  . A bad rash all over body  . Dizziness and weakness   Immunizations Administered    Name Date Dose VIS Date Route   Pfizer COVID-19 Vaccine 11/03/2019  3:49 PM 0.3 mL 08/25/2018 Intramuscular   Manufacturer: [employer], [employer]   Lot: [id number]   NDC: [address]-4

## 2019-11-04 ENCOUNTER — Ambulatory Visit: Payer: Medicaid Other

## 2020-01-04 ENCOUNTER — Ambulatory Visit: Payer: Medicaid Other | Admitting: Family Medicine

## 2020-05-30 IMAGING — CR PORTABLE CHEST - 1 VIEW
1 series · 1 of 1 positions shown · non-contrast
Comparison: None.

CLINICAL DATA: Chronic cough.  Recent Q3X0H-PD exposure.  No fever.

EXAM:
PORTABLE CHEST 1 VIEW

[portable]
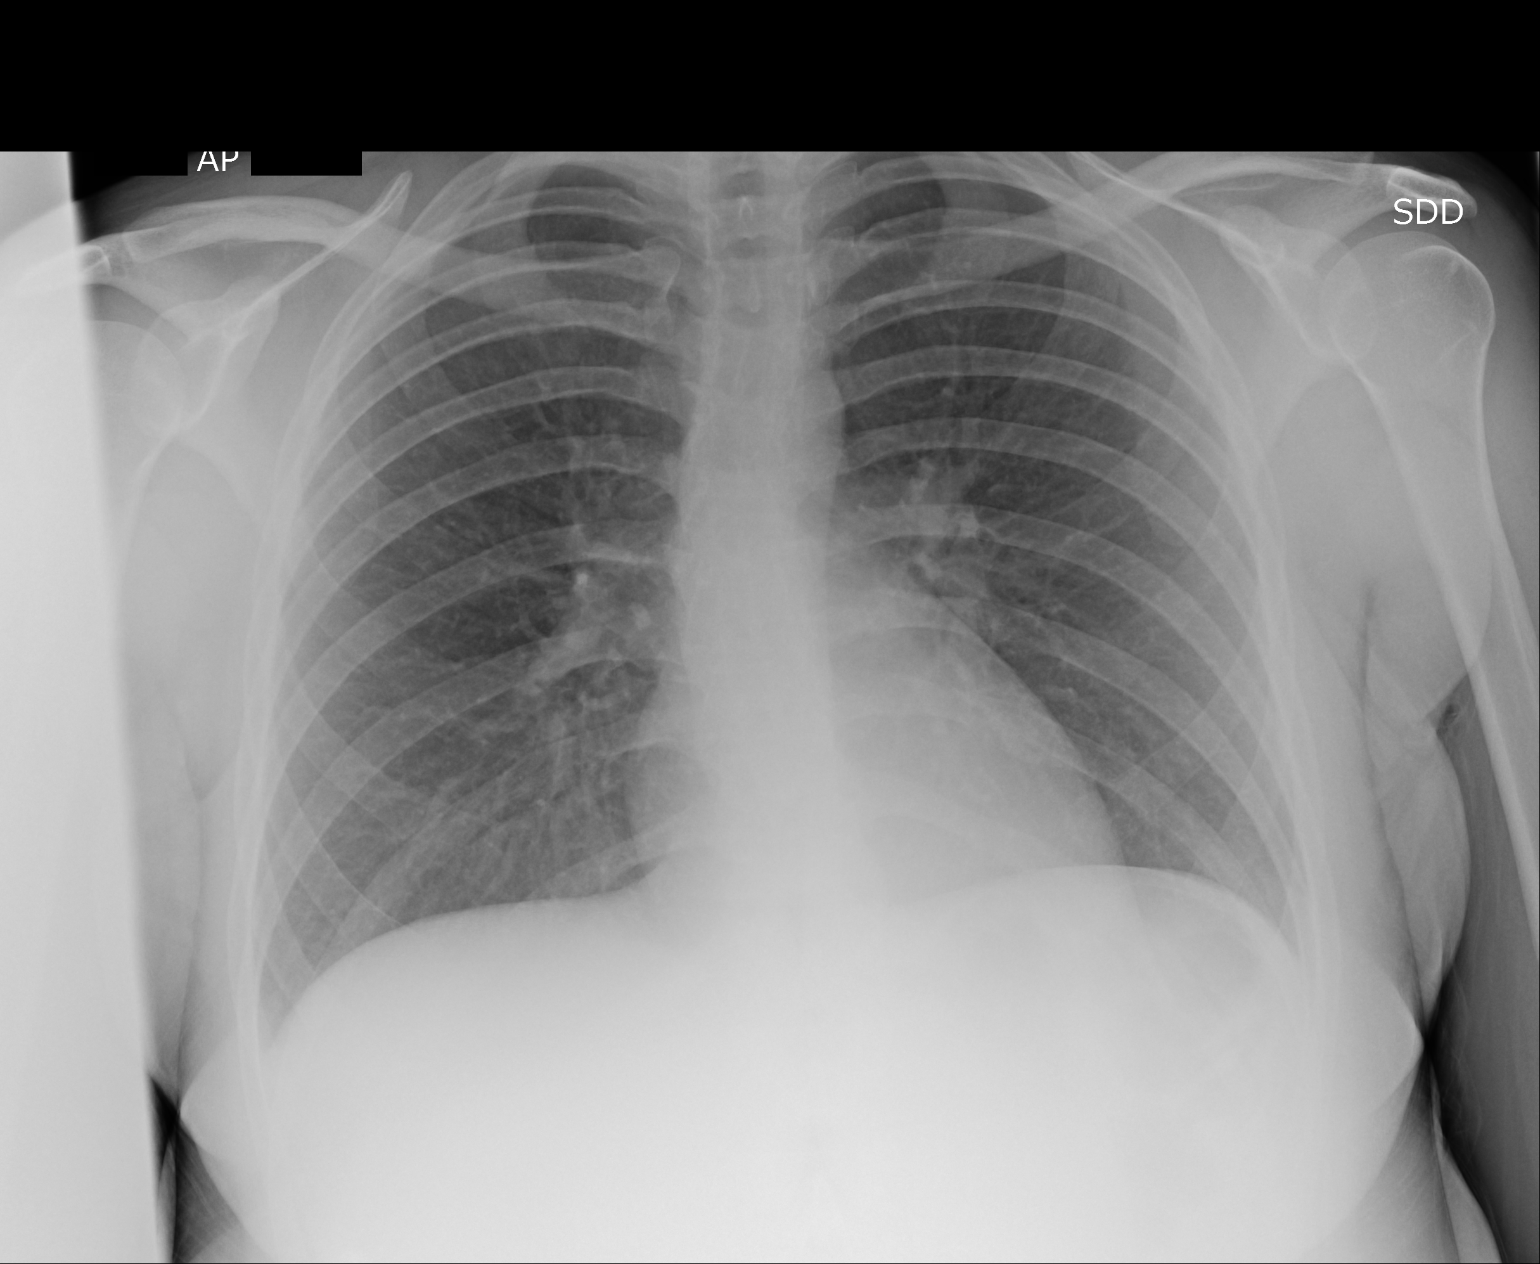

[1 of 1 positions shown; findings below may reference images not displayed]

FINDINGS: The heart size and mediastinal contours are within normal limits.
Both lungs are clear. The visualized skeletal structures are
unremarkable.
IMPRESSION: No active disease.
